# Patient Record
Sex: Female | Born: 1960 | Race: Black or African American | Hispanic: No | Marital: Single | Smoking: Never smoker
Health system: Southern US, Community
[De-identification: ages and names within clinical notes are randomized; demographics above are authoritative.]

## PROBLEM LIST (undated history)

## (undated) DIAGNOSIS — I10 Essential (primary) hypertension: Secondary | ICD-10-CM

## (undated) HISTORY — PX: NASAL SINUS SURGERY: SHX719

---

## 2015-04-06 ENCOUNTER — Encounter: Payer: Self-pay | Admitting: *Deleted

## 2015-04-06 ENCOUNTER — Ambulatory Visit (INDEPENDENT_AMBULATORY_CARE_PROVIDER_SITE_OTHER): Payer: BLUE CROSS/BLUE SHIELD

## 2015-04-06 ENCOUNTER — Ambulatory Visit
Admission: EM | Admit: 2015-04-06 | Discharge: 2015-04-06 | Disposition: A | Discharge status: Home / Self Care | Payer: BLUE CROSS/BLUE SHIELD | Attending: Family Medicine | Admitting: Family Medicine

## 2015-04-06 DIAGNOSIS — R0789 Other chest pain: Secondary | ICD-10-CM

## 2015-04-06 DIAGNOSIS — R079 Chest pain, unspecified: Secondary | ICD-10-CM | POA: Diagnosis not present

## 2015-04-06 DIAGNOSIS — I10 Essential (primary) hypertension: Secondary | ICD-10-CM | POA: Insufficient documentation

## 2015-04-06 HISTORY — DX: Essential (primary) hypertension: I10

## 2015-04-06 LAB — COMPREHENSIVE METABOLIC PANEL
ALBUMIN: 4.2 g/dL (ref 3.5–5.0)
ALT: 14 U/L (ref 14–54)
ANION GAP: 8 (ref 5–15)
AST: 27 U/L (ref 15–41)
Alkaline Phosphatase: 43 U/L (ref 38–126)
BUN: 10 mg/dL (ref 6–20)
CALCIUM: 9.4 mg/dL (ref 8.9–10.3)
CO2: 29 mmol/L (ref 22–32)
Chloride: 102 mmol/L (ref 101–111)
Creatinine, Ser: 0.98 mg/dL (ref 0.44–1.00)
GFR calc non Af Amer: >60 mL/min (ref >60)
GLUCOSE: 88 mg/dL (ref 65–99)
Potassium: 4 mmol/L (ref 3.5–5.1)
SODIUM: 139 mmol/L (ref 135–145)
TOTAL PROTEIN: 7.1 g/dL (ref 6.5–8.1)
Total Bilirubin: 1.5 mg/dL — ABNORMAL HIGH (ref 0.3–1.2)

## 2015-04-06 LAB — CBC WITH DIFFERENTIAL/PLATELET
BASOS ABS: 0 10*3/uL (ref 0–0.1)
BASOS PCT: 1 %
Eosinophils Absolute: 0.1 10*3/uL (ref 0–0.7)
Eosinophils Relative: 1 %
HEMATOCRIT: 40.6 % (ref 35.0–47.0)
Hemoglobin: 13.5 g/dL (ref 12.0–16.0)
Lymphocytes Relative: 24 %
Lymphs Abs: 1.3 10*3/uL (ref 1.0–3.6)
MCH: 30.8 pg (ref 26.0–34.0)
MCHC: 33.2 g/dL (ref 32.0–36.0)
MCV: 92.8 fL (ref 80.0–100.0)
MONO ABS: 0.5 10*3/uL (ref 0.2–0.9)
Monocytes Relative: 10 %
NEUTROS ABS: 3.5 10*3/uL (ref 1.4–6.5)
Neutrophils Relative %: 64 %
PLATELETS: 207 10*3/uL (ref 150–440)
RBC: 4.38 MIL/uL (ref 3.80–5.20)
RDW: 12.5 % (ref 11.5–14.5)
WBC: 5.5 10*3/uL (ref 3.6–11.0)

## 2015-04-06 LAB — TROPONIN I

## 2015-04-06 LAB — CK: CK TOTAL: 114 U/L (ref 38–234)

## 2015-04-06 LAB — CKMB (ARMC ONLY): CK, MB: 1.4 ng/mL (ref 0.5–5.0)

## 2015-04-06 LAB — FIBRIN DERIVATIVES D-DIMER (ARMC ONLY): FIBRIN DERIVATIVES D-DIMER (ARMC): 333 (ref 0–499)

## 2015-04-06 MED ORDER — GI COCKTAIL ~~LOC~~
30.0000 mL | Freq: Once | ORAL | Status: AC
  Administered 2015-04-06: 30 mL via ORAL

## 2015-04-06 MED ORDER — SUCRALFATE 1 G PO TABS: 2.0000 g | ORAL_TABLET | Freq: Two times a day (BID) | ORAL | Status: DC

## 2015-04-06 MED ORDER — ASPIRIN 81 MG PO CHEW
324.0000 mg | CHEWABLE_TABLET | Freq: Once | ORAL | Status: AC
  Administered 2015-04-06: 324 mg via ORAL

## 2015-04-06 NOTE — ED Notes (Signed)
[facility] [place] [facility] with Rx for Carafate. Take 2 tablets (2G) po bid x 7 days. #28. No refills. Per T. Betancourt NP as order written by Dr. Thurmond ButtsWade did not go through to pharmacy. Pt. Called and notified Rx called.

## 2015-04-06 NOTE — ED Notes (Signed)
Chest pain on and off x three weeks, feels like there is acid in her stomach, chest pain with eating at first, now complained of shortness of breath and pain in church yesterday.  Feels like a heaviness in her chest on and off for the last three weeks.

## 2015-04-06 NOTE — ED Provider Notes (Addendum)
CSN: [id number]     Arrival date & time 04/06/15  1050 History   First MD Initiated Contact with Patient 04/06/15 1121    Nurses notes were reviewed. Chief Complaint  Patient presents with  . Chest Pain    Patient had dental work about 3 weeks ago and soon afterwards started having chest pain. Chest pain was nonspecific but did radiate up into her chest and upper neck and esophagus. She states that morning time you see was the best time but it was she was exercising she did not have much symptoms such as which was walking and working out playing tennis. She found that when she was at rest started getting hungry started having this chest pain described above. The pain would get worse until she ate and then it would abate but then was come back again. This is been occurring off and on for the last 3 weeks and started up this morning as well. She states she's been drinking more sodas trying to belch think that would help but this morning she did not take any Caffeine products or carbonate product just water which did not do anything for her. Yesterday she had chest pain she took [place] powder states that she felt she was getting more discomfort and indigestion. She has a history hypertension but no history of diabetes no history coronary artery disease in the past either. Her father died in his early 3960s from congestive heart failure but her 3 sisters have been healthy.  She does not smoke. (Consider location/radiation/quality/duration/timing/severity/associated sxs/prior Treatment) Patient is a 54 y.o. female presenting with chest pain. The history is provided by the patient. No language interpreter was used.  Chest Pain Pain location:  Substernal area and epigastric Pain quality: radiating and stabbing   Pain radiates to:  Precordial region and epigastrium Pain radiates to the back: no   Pain severity:  No pain Onset quality:  Sudden Timing:  Intermittent Progression:  Waxing and waning Context:  movement   Relieved by:  Nothing Worsened by:  Nothing tried Ineffective treatments:  None tried Associated symptoms: abdominal pain and shortness of breath   Associated symptoms: no fever and no palpitations     Past Medical History  Diagnosis Date  . Hypertension    No past surgical history on file. Family History  Problem Relation Age of Onset  . Thyroid disease Mother   . Diabetes Father   . Heart failure Father    Social History  Substance Use Topics  . Smoking status: Not on file  . Smokeless tobacco: Never Used  . Alcohol Use: Yes   OB History    No data available     Review of Systems  Constitutional: Negative for fever and activity change.  HENT: Positive for dental problem.   Respiratory: Positive for shortness of breath.   Cardiovascular: Positive for chest pain. Negative for palpitations and leg swelling.  Gastrointestinal: Positive for abdominal pain.       Digestion-like symptoms for last 3 weeks as well.  All other systems reviewed and are negative.   Allergies  Yeast-related products  Home Medications   Prior to Admission medications   Medication Sig Start Date End Date Taking? Authorizing Provider  sucralfate (CARAFATE) 1 G tablet Take 2 tablets (2 g total) by mouth 2 (two) times daily. An hour before eating. 04/06/15   Hassan RowanEugene [place], MD   Meds Ordered and Administered this Visit   Medications  gi cocktail (Maalox,Lidocaine,Donnatal) (30 mLs Oral Given  04/06/15 1130)  aspirin chewable tablet 324 mg (324 mg Oral Given 04/06/15 1130)    BP 130/67 mmHg  Pulse 87  Resp 18  Ht  (1.651 m)  Wt 152 lb (68.947 kg)  BMI 25.29 kg/m2  SpO2 100% No data found.   Physical Exam  Constitutional: She is oriented to person, place, and time. She appears well-developed and well-nourished.  HENT:  Head: Normocephalic and atraumatic.  Eyes: Conjunctivae are normal. Pupils are equal, round, and reactive to light.  Neck: Normal range of motion. Neck  supple. No thyromegaly present.  Cardiovascular: Normal rate, regular rhythm and normal heart sounds.   Pulmonary/Chest: Effort normal and breath sounds normal. No respiratory distress. She has no wheezes.  Abdominal: Soft. Bowel sounds are normal. She exhibits no distension.  Musculoskeletal: Normal range of motion. She exhibits no edema.  Lymphadenopathy:    She has no cervical adenopathy.  Neurological: She is alert and oriented to person, place, and time. She has normal reflexes.  Skin: Skin is warm and dry. No erythema.  Vitals reviewed.   ED Course  Procedures (including critical care time)  Labs Review Labs Reviewed  COMPREHENSIVE METABOLIC PANEL - Abnormal; Notable for the following:    Total Bilirubin 1.5 (*)    All other components within normal limits  FIBRIN DERIVATIVES D-DIMER ([facility] ONLY)  TROPONIN I  CK  CKMB([facility] ONLY)  CBC WITH DIFFERENTIAL/PLATELET    Imaging Review Dg Chest 2 View  04/06/2015  CLINICAL DATA:  Mid sternal pain for 3 weeks, no injury EXAM: CHEST  2 VIEW COMPARISON:  None. FINDINGS: No active infiltrate or effusion is seen. Mediastinal and hilar contours are unremarkable. The heart is within normal limits in size. No acute bony abnormality is seen. IMPRESSION: No active cardiopulmonary disease. Electronically Signed   By: Dwyane Dee M.D.   On: 04/06/2015 12:17     Visual Acuity Review  Right Eye Distance:   Left Eye Distance:   Bilateral Distance:    Right Eye Near:   Left Eye Near:    Bilateral Near:      ED ECG REPORT   Date: 04/06/2015  EKG Time: 1:05 PM  Rate: 64  Rhythm: normal sinus rhythm,  normal EKG, normal sinus rhythm, there are no previous tracings available for comparison, normal sinus rhythm  Axis: 30  Intervals:none  ST&T Change: none  Narrative Interpretation: NSR         Results for orders placed or performed during the hospital encounter of 04/06/15  Fibrin derivatives D-Dimer ([facility] only)  Result Value Ref  Range   Fibrin derivatives D-dimer ([facility]) 333 0 - 499  Troponin I  Result Value Ref Range   Troponin I <0.03 <0.031 ng/mL  CK  Result Value Ref Range   Total CK 114 38 - 234 U/L  CKMB([facility] only)  Result Value Ref Range   CK, MB 1.4 0.5 - 5.0 ng/mL  Comprehensive metabolic panel  Result Value Ref Range   Sodium 139 135 - 145 mmol/L   Potassium 4.0 3.5 - 5.1 mmol/L   Chloride 102 101 - 111 mmol/L   CO2 29 22 - 32 mmol/L   Glucose, Bld 88 65 - 99 mg/dL   BUN 10 6 - 20 mg/dL   Creatinine, Ser 1.61 0.44 - 1.00 mg/dL   Calcium 9.4 8.9 - 09.6 mg/dL   Total Protein 7.1 6.5 - 8.1 g/dL   Albumin 4.2 3.5 - 5.0 g/dL   AST 27 15 -  41 U/L   ALT 14 14 - 54 U/L   Alkaline Phosphatase 43 38 - 126 U/L   Total Bilirubin 1.5 (H) 0.3 - 1.2 mg/dL   GFR calc non Af Amer >60 >60 mL/min   GFR calc Af Amer >60 >60 mL/min   Anion gap 8 5 - 15  CBC with Differential  Result Value Ref Range   WBC 5.5 3.6 - 11.0 K/uL   RBC 4.38 3.80 - 5.20 MIL/uL   Hemoglobin 13.5 12.0 - 16.0 g/dL   HCT 78.2 95.6 - 21.3 %   MCV 92.8 80.0 - 100.0 fL   MCH 30.8 26.0 - 34.0 pg   MCHC 33.2 32.0 - 36.0 g/dL   RDW 08.6 57.8 - 46.9 %   Platelets 207 150 - 440 K/uL   Neutrophils Relative % 64 %   Neutro Abs 3.5 1.4 - 6.5 K/uL   Lymphocytes Relative 24 %   Lymphs Abs 1.3 1.0 - 3.6 K/uL   Monocytes Relative 10 %   Monocytes Absolute 0.5 0.2 - 0.9 K/uL   Eosinophils Relative 1 %   Eosinophils Absolute 0.1 0 - 0.7 K/uL   Basophils Relative 1 %   Basophils Absolute 0.0 0 - 0.1 K/uL    MDM   1. Chest pain at rest   2. Chest pain, atypical      Patient was given a GI cocktail. Initially she stated that the GI cocktail did nothing for her. With further questioning she states that the bottom or the more mid sternal discomfort has improved she still has discomfort in the upper esophagus and upper sternal discomfort. Explained to her that the lab work were all normal chest x-ray was normal at this time cannot find  anything to indicate cardiac disease for some and having chest discomfort for 3 weeks. This point I recommend follow-up with her PCP possible stress test as an outpatient and referral to GI for upper endoscopy to evaluate her esophagus. We'll place to place her on Carafate 2 tablets twice a day to see if this helps with esophageal discomfort and go to the ED if chest pain recurs or becomes worse.   Hassan Rowan, MD 04/06/15 1210  Hassan Rowan, MD 04/06/15 [phone]

## 2015-04-06 NOTE — Discharge Instructions (Signed)
Cardiac-Specific Troponin I and T Test WHY AM I HAVING THIS TEST? You may have this test if you have experienced chest pain. The test can be used to determine if you have had a heart attack or injury to heart (cardiac) muscle. This test can also help predict the possibility of future heart attacks. This test measures the concentration of cardiac-specific troponin in your blood. Troponins are proteins that help muscles contract. There are three forms of troponin, including troponins C, I, and T. The types of troponins I and T that are found in cardiac muscle are different from the troponins I and T that are found in skeletal muscle. Therefore, testing can be done for cardiac-specific troponins I and T. These types of troponin are normally present in very small quantities in the blood. When there is damage to heart muscle cells, cardiac troponins I and T are released into circulation. The more damage there is, the greater the concentration of troponins I and T. When a person has a heart attack, levels of troponin can become elevated in the blood within 3-4 hours after injury and may remain elevated for 10-14 days. WHAT KIND OF SAMPLE IS TAKEN? A blood sample is required for this test. It is usually collected by inserting a needle into a vein. Usually, an initial blood sample is collected, and then another blood sample is collected 12 hours later. After these samples, you will have your blood tested daily for 3-5 days. You might also have it tested weekly for 5-6 weeks. HOW DO I PREPARE FOR THE TEST? There is no preparation required for this test. However, be aware that you will need to make arrangements to have your blood collected frequently.  WHAT ARE THE REFERENCE RANGES? Reference values are considered healthy values established after testing a large group of healthy people. Reference values may vary among different people, labs, and hospitals. It is your responsibility to obtain your test results. Ask  the lab or department performing the test when and how you will get your results. Reference values for cardiac troponins are as follows:  Cardiac troponin T: less than 0.1 ng/mL.  Cardiac troponin I: less than 0.03 ng/mL. WHAT DO THE RESULTS MEAN? Troponin values above the reference values may indicate:  Injury to the heart muscle.  Heart attack. Talk with your health care provider to discuss your results, treatment options, and if necessary, the need for more tests. Talk with your health care provider if you have any questions about your results.   This information is not intended to replace advice given to you by your health care provider. Make sure you discuss any questions you have with your health care provider.   Document Released: 06/18/2004 Document Revised: 06/06/2014 Document Reviewed: 10/09/2013 Elsevier Interactive Patient Education 2016 [employer].  Cardiac-Specific Troponin I and T Test WHY AM I HAVING THIS TEST? You may have this test if you have experienced chest pain. The test can be used to determine if you have had a heart attack or injury to heart (cardiac) muscle. This test can also help predict the possibility of future heart attacks. This test measures the concentration of cardiac-specific troponin in your blood. Troponins are proteins that help muscles contract. There are three forms of troponin, including troponins C, I, and T. The types of troponins I and T that are found in cardiac muscle are different from the troponins I and T that are found in skeletal muscle. Therefore, testing can be done for cardiac-specific troponins I  and T. These types of troponin are normally present in very small quantities in the blood. When there is damage to heart muscle cells, cardiac troponins I and T are released into circulation. The more damage there is, the greater the concentration of troponins I and T. When a person has a heart attack, levels of troponin can become elevated in  the blood within 3-4 hours after injury and may remain elevated for 10-14 days. WHAT KIND OF SAMPLE IS TAKEN? A blood sample is required for this test. It is usually collected by inserting a needle into a vein. Usually, an initial blood sample is collected, and then another blood sample is collected 12 hours later. After these samples, you will have your blood tested daily for 3-5 days. You might also have it tested weekly for 5-6 weeks. HOW DO I PREPARE FOR THE TEST? There is no preparation required for this test. However, be aware that you will need to make arrangements to have your blood collected frequently.  WHAT ARE THE REFERENCE RANGES? Reference values are considered healthy values established after testing a large group of healthy people. Reference values may vary among different people, labs, and hospitals. It is your responsibility to obtain your test results. Ask the lab or department performing the test when and how you will get your results. Reference values for cardiac troponins are as follows:  Cardiac troponin T: less than 0.1 ng/mL.  Cardiac troponin I: less than 0.03 ng/mL. WHAT DO THE RESULTS MEAN? Troponin values above the reference values may indicate:  Injury to the heart muscle.  Heart attack. Talk with your health care provider to discuss your results, treatment options, and if necessary, the need for more tests. Talk with your health care provider if you have any questions about your results.   This information is not intended to replace advice given to you by your health care provider. Make sure you discuss any questions you have with your health care provider.   Document Released: 06/18/2004 Document Revised: 06/06/2014 Document Reviewed: 10/09/2013 Elsevier Interactive Patient Education 2016 [employer]  Exercise Stress Electrocardiogram An exercise stress electrocardiogram is a test to check how blood flows to your heart. It is done to find areas of poor  blood flow. You will need to walk on a treadmill for this test. The electrocardiogram will record your heartbeat when you are at rest and when you are exercising. BEFORE THE PROCEDURE  Do not have drinks with caffeine or foods with caffeine for 24 hours before the test, or as told by your doctor. This includes coffee, tea (even decaf tea), sodas, chocolate, and cocoa.  Follow your doctor's instructions about eating and drinking before the test.  Ask your doctor what medicines you should or should not take before the test. Take your medicines with water unless told by your doctor not to.  If you use an inhaler, bring it with you to the test.  Bring a snack to eat after the test.  Do not  smoke for 4 hours before the test.  Do not put lotions, powders, creams, or oils on your chest before the test.  Wear comfortable shoes and clothing. PROCEDURE  You will have patches put on your chest. Small areas of your chest may need to be shaved. Wires will be connected to the patches.  Your heart rate will be watched while you are resting and while you are exercising.  You will walk on the treadmill. The treadmill will slowly get faster  to raise your heart rate.  The test will take about 1-2 hours. AFTER THE PROCEDURE  Your heart rate and blood pressure will be watched after the test.  You may return to your normal diet, activities, and medicines or as told by your doctor.   This information is not intended to replace advice given to you by your health care provider. Make sure you discuss any questions you have with your health care provider.   Document Released: 11/02/2007 Document Revised: 06/06/2014 Document Reviewed: 01/21/2013 Elsevier Interactive Patient Education 2016 [employer] Inc[employer]  Nonspecific Chest Pain It is often hard to find the cause of chest pain. There is always a chance that your pain could be related to something serious, such as a heart attack or a blood clot in your  lungs. Chest pain can also be caused by conditions that are not life-threatening. If you have chest pain, it is very important to follow up with your doctor.  HOME CARE  If you were prescribed an antibiotic medicine, finish it all even if you start to feel better.  Avoid any activities that cause chest pain.  Do not use any tobacco products, including cigarettes, chewing tobacco, or electronic cigarettes. If you need help quitting, ask your doctor.  Do not drink alcohol.  Take medicines only as told by your doctor.  Keep all follow-up visits as told by your doctor. This is important. This includes any further testing if your chest pain does not go away.  Your doctor may tell you to keep your head raised (elevated) while you sleep.  Make lifestyle changes as told by your doctor. These may include:  Getting regular exercise. Ask your doctor to suggest some activities that are safe for you.  Eating a heart-healthy diet. Your doctor or a diet specialist (dietitian) can help you to learn healthy eating options.  Maintaining a healthy weight.  Managing diabetes, if necessary.  Reducing stress. GET HELP IF:  Your chest pain does not go away, even after treatment.  You have a rash with blisters on your chest.  You have a fever. GET HELP RIGHT AWAY IF:  Your chest pain is worse.  You have an increasing cough, or you cough up blood.  You have severe belly (abdominal) pain.  You feel extremely weak.  You pass out (faint).  You have chills.  You have sudden, unexplained chest discomfort.  You have sudden, unexplained discomfort in your arms, back, neck, or jaw.  You have shortness of breath at any time.  You suddenly start to sweat, or your skin gets clammy.  You feel nauseous.  You vomit.  You suddenly feel light-headed or dizzy.  Your heart begins to beat quickly, or it feels like it is skipping beats. These symptoms may be an emergency. Do not wait to see if  the symptoms will go away. Get medical help right away. Call your local emergency services (911 in the [place] Do not drive yourself to the hospital.   This information is not intended to replace advice given to you by your health care provider. Make sure you discuss any questions you have with your health care provider.   Document Released: 11/02/2007 Document Revised: 06/06/2014 Document Reviewed: 12/20/2013 Elsevier Interactive Patient Education [employer]

## 2015-04-06 NOTE — OR Nursing (Signed)
Report to Leona CarryLynne Anne Wolfe, RN. Dr. Thurmond ButtsWade aware that GI cocktail given and little relief after 30 minutes.  Pain in chest is still vague, no shortness of breath or diaphoresis.

## 2016-10-16 IMAGING — CR DG CHEST 2V
2 series · 2 of 2 positions shown · non-contrast
Comparison: None.

CLINICAL DATA: Mid sternal pain for 3 weeks, no injury

EXAM:
CHEST  2 VIEW

[chest pa]
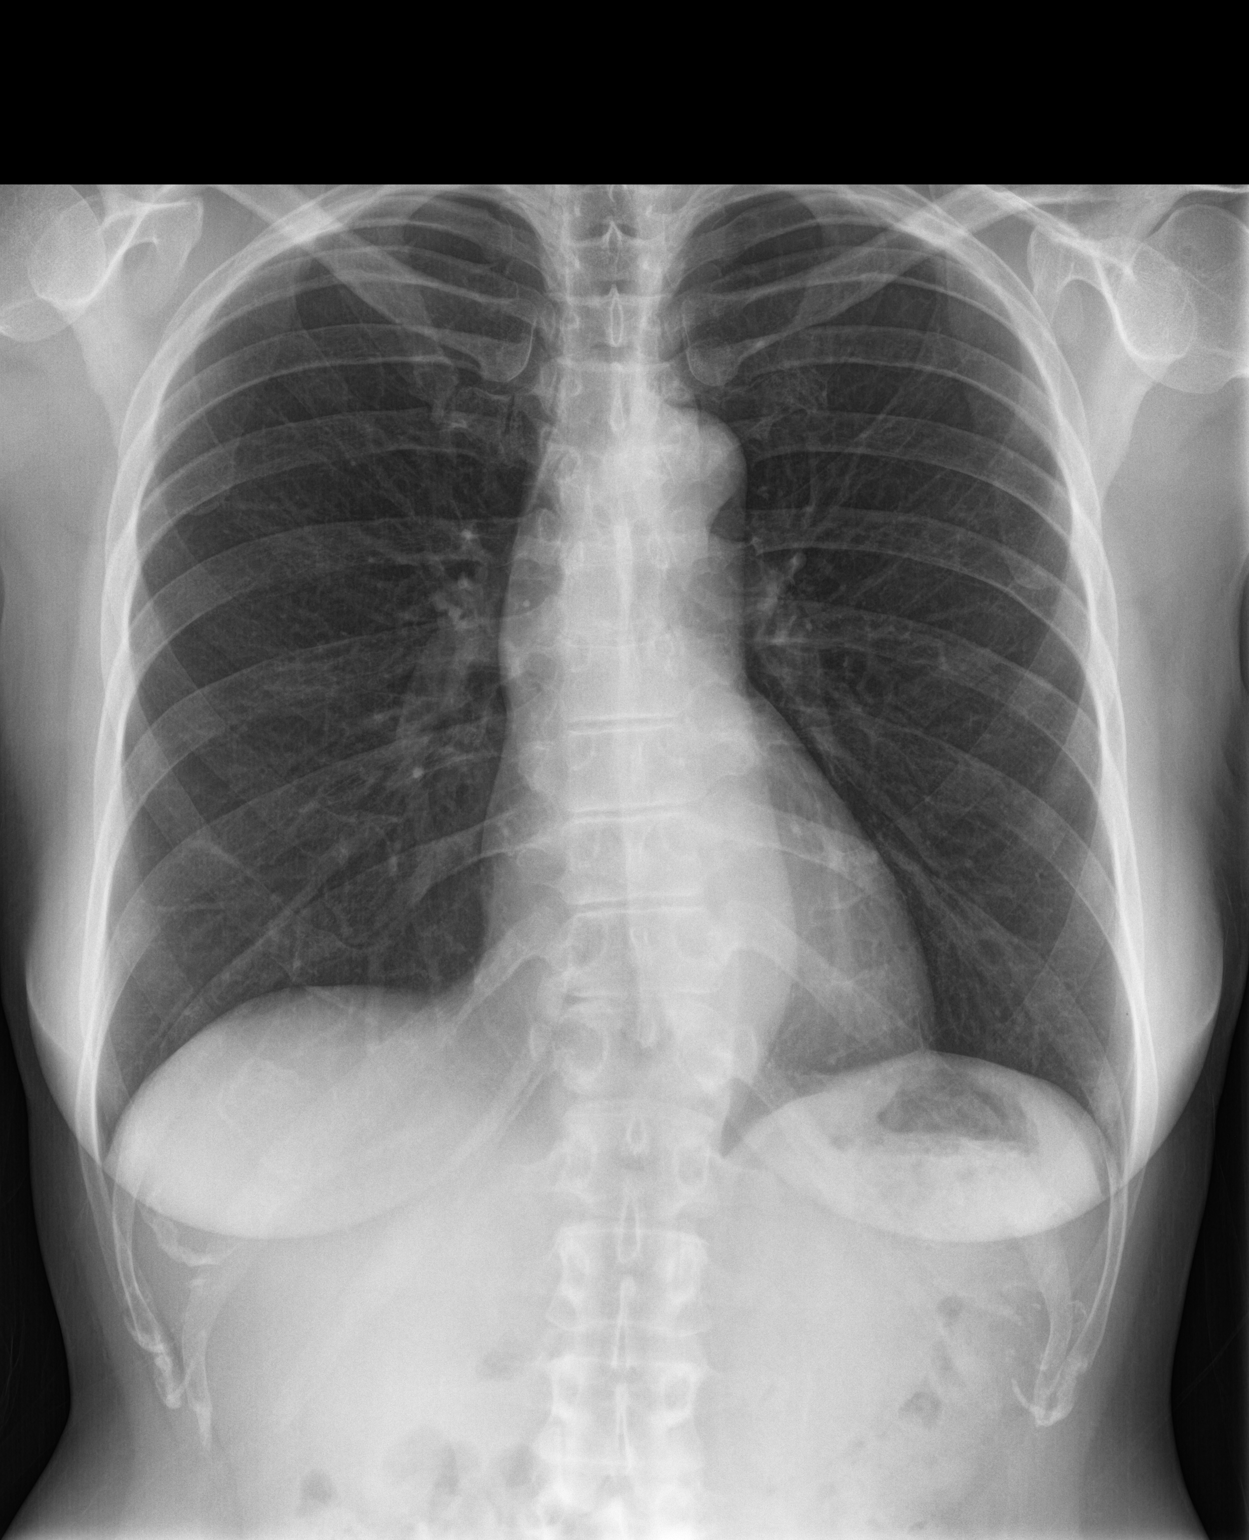

[chest lat]
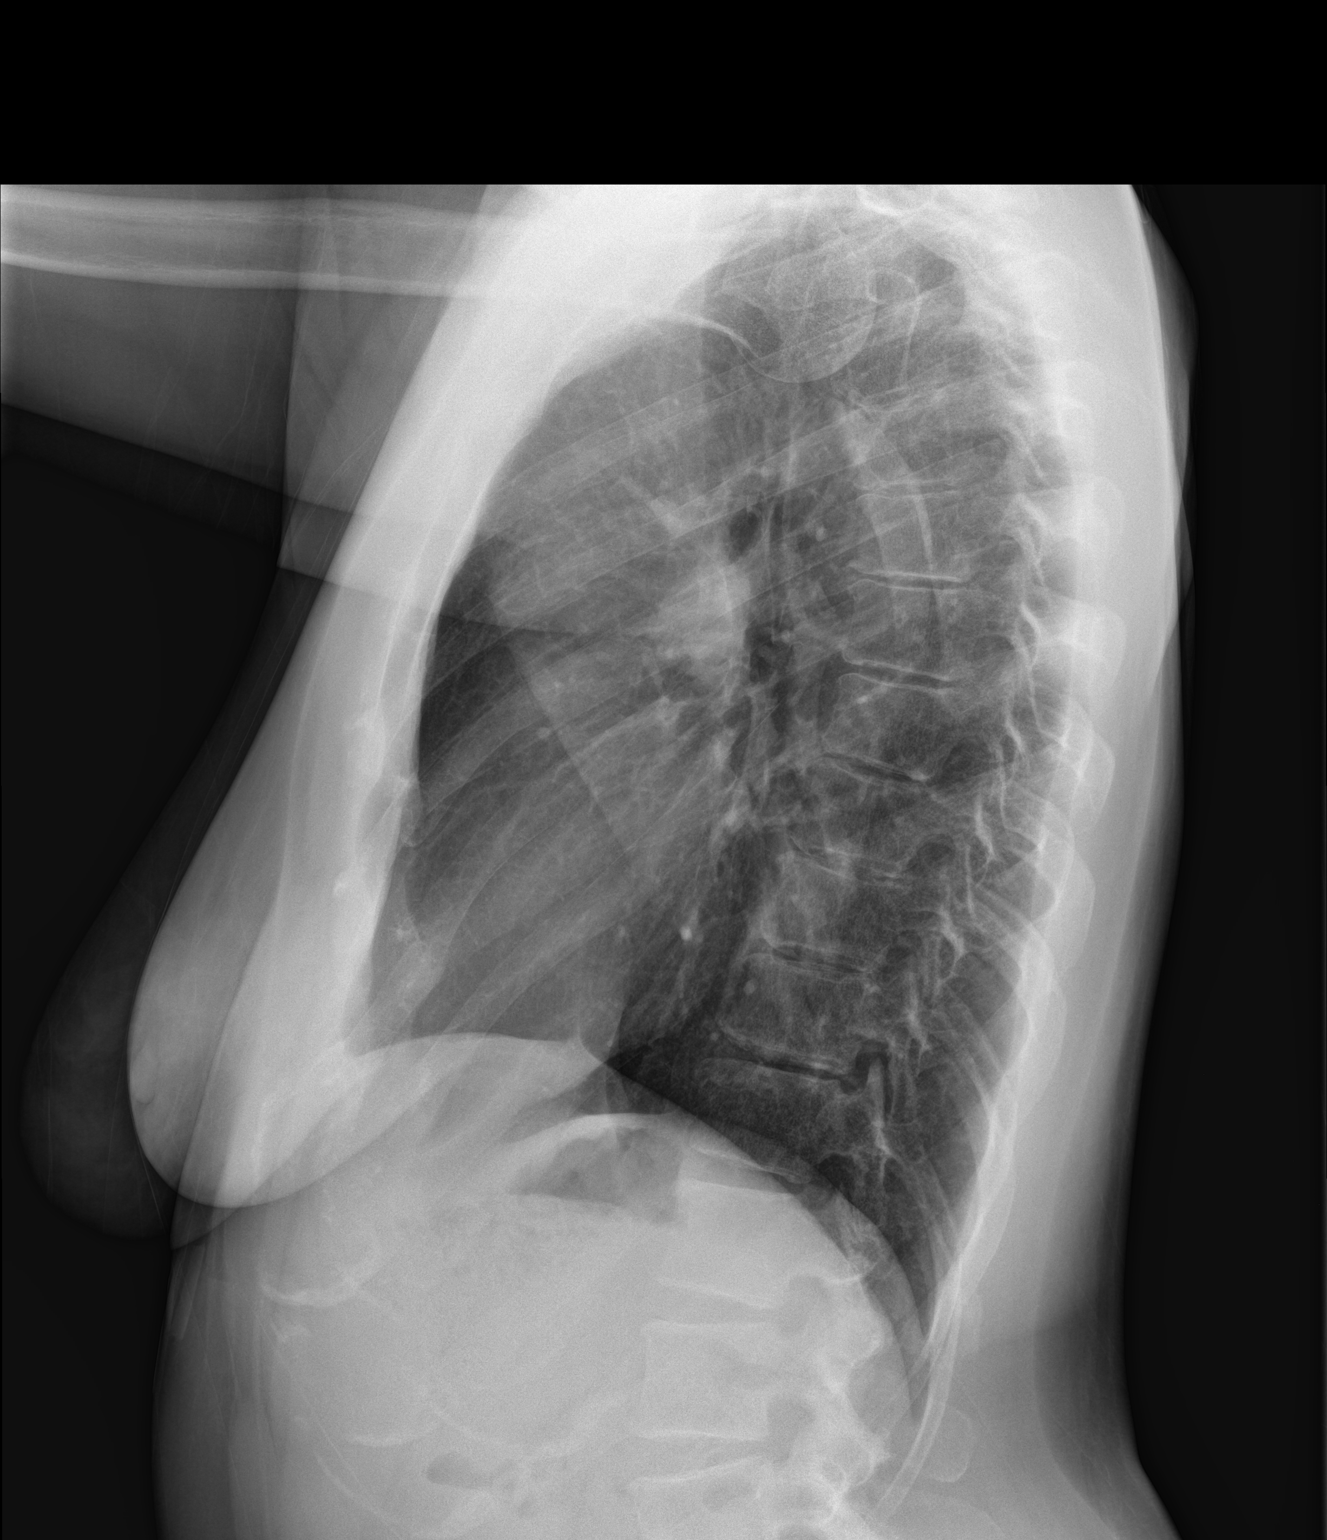

[2 of 2 positions shown; findings below may reference images not displayed]

FINDINGS: No active infiltrate or effusion is seen. Mediastinal and hilar
contours are unremarkable. The heart is within normal limits in
size. No acute bony abnormality is seen.
IMPRESSION: No active cardiopulmonary disease.

## 2022-04-06 ENCOUNTER — Ambulatory Visit: Payer: BLUE CROSS/BLUE SHIELD

## 2022-04-15 ENCOUNTER — Ambulatory Visit: Admit: 2022-04-15 | Payer: BLUE CROSS/BLUE SHIELD | Source: Home / Self Care

## 2022-04-15 ENCOUNTER — Ambulatory Visit (INDEPENDENT_AMBULATORY_CARE_PROVIDER_SITE_OTHER): Payer: BC Managed Care – PPO

## 2022-04-15 ENCOUNTER — Ambulatory Visit
Admission: EM | Admit: 2022-04-15 | Discharge: 2022-04-15 | Disposition: A | Discharge status: Home / Self Care | Payer: BC Managed Care – PPO | Attending: Emergency Medicine | Admitting: Emergency Medicine

## 2022-04-15 VITALS — BP 155/97 | HR 85 | Temp 98.1°F | Resp 14 | Ht 65.0 in | Wt 151.9 lb

## 2022-04-15 DIAGNOSIS — E119 Type 2 diabetes mellitus without complications: Secondary | ICD-10-CM | POA: Diagnosis not present

## 2022-04-15 DIAGNOSIS — J309 Allergic rhinitis, unspecified: Secondary | ICD-10-CM | POA: Diagnosis present

## 2022-04-15 DIAGNOSIS — Z1152 Encounter for screening for COVID-19: Secondary | ICD-10-CM | POA: Insufficient documentation

## 2022-04-15 DIAGNOSIS — R0982 Postnasal drip: Secondary | ICD-10-CM

## 2022-04-15 DIAGNOSIS — R519 Headache, unspecified: Secondary | ICD-10-CM | POA: Diagnosis not present

## 2022-04-15 DIAGNOSIS — Z79899 Other long term (current) drug therapy: Secondary | ICD-10-CM | POA: Diagnosis not present

## 2022-04-15 DIAGNOSIS — K219 Gastro-esophageal reflux disease without esophagitis: Secondary | ICD-10-CM | POA: Diagnosis not present

## 2022-04-15 DIAGNOSIS — R059 Cough, unspecified: Secondary | ICD-10-CM | POA: Diagnosis not present

## 2022-04-15 DIAGNOSIS — R058 Other specified cough: Secondary | ICD-10-CM | POA: Insufficient documentation

## 2022-04-15 DIAGNOSIS — I10 Essential (primary) hypertension: Secondary | ICD-10-CM | POA: Insufficient documentation

## 2022-04-15 LAB — RESP PANEL BY RT-PCR (RSV, FLU A&B, COVID)  RVPGX2
Influenza A by PCR: NEGATIVE
Influenza B by PCR: NEGATIVE
Resp Syncytial Virus by PCR: NEGATIVE
SARS Coronavirus 2 by RT PCR: NEGATIVE

## 2022-04-15 MED ORDER — PROMETHAZINE-DM 6.25-15 MG/5ML PO SYRP: 5.0000 mL | ORAL_SOLUTION | Freq: Four times a day (QID) | ORAL | 0 refills | Status: DC | PRN

## 2022-04-15 MED ORDER — AMOXICILLIN-POT CLAVULANATE 875-125 MG PO TABS: 1.0000 | ORAL_TABLET | Freq: Two times a day (BID) | ORAL | 0 refills | Status: DC

## 2022-04-15 MED ORDER — PREDNISONE 10 MG (21) PO TBPK: ORAL_TABLET | ORAL | 0 refills | Status: DC

## 2022-04-15 NOTE — ED Triage Notes (Signed)
Patient c/o cough and chest congestion for 9 days.  Patient also reports post nasal drip.  Patient finished her Azithromycin.  Patient also has been taking her Tessalon Perles for the cough. Patient denies fevers.

## 2022-04-15 NOTE — ED Provider Notes (Signed)
HPI  SUBJECTIVE:  Susan Zavala is a 61 y.o. female who presents with postnasal drip for 9 days, sore throat secondary to postnasal drip, cough productive of white mucus.  She is unable to sleep at night secondary to the cough.  No fevers, nasal congestion, rhinorrhea, sinus pain or pressure, wheezing or shortness of breath, dyspnea exertion, GERD symptoms.  No itchy, watery eyes, sneezing, however, she has multiple environmental allergies that are worse at this time of year.  She had an ED visit about 6 days ago, was thought to have bronchitis and was prescribed azithromycin and Tessalon.  She finished azithromycin today.  She has been taking Zyrtec, DayQuil, over-the-counter Alka-Seltzer plus.  No alleviating factors.  Symptoms are worse at night.  She had negative home COVID test.  No known COVID, flu, RSV exposure.  She got 4 doses of the COVID-vaccine.  She did not get this years flu vaccine.  Patient has a past history of GERD, hypertension, environmental allergies.  She states that her ENT normally prescribes  fluconazole 100 mg for 10 days and clotrimazole 10 mg troches twice daily for 14 days when her allergies get this severe, however, he is currently out of town.  No history of pulmonary disease, frequent sinusitis.  ENT: [facility]  Past Medical History:  Diagnosis Date   Hypertension     History reviewed. No pertinent surgical history.  Family History  Problem Relation Age of Onset   Thyroid disease Mother    Diabetes Father    Heart failure Father     Social History   Tobacco Use   Smoking status: Never   Smokeless tobacco: Never  Vaping Use   Vaping Use: Never used  Substance Use Topics   Alcohol use: Yes   Drug use: No    No current facility-administered medications for this encounter.  Current Outpatient Medications:    amoxicillin-clavulanate (AUGMENTIN) 875-125 MG tablet, Take 1 tablet by mouth every 12 (twelve) hours., Disp: 14 tablet, Rfl: 0   Cetirizine  HCl (ZYRTEC ALLERGY) 10 MG CAPS, Take 1 capsule every day by oral route., Disp: , Rfl:    hydrochlorothiazide (HYDRODIURIL) 25 MG tablet, Take 1 tablet by mouth daily., Disp: , Rfl:    irbesartan (AVAPRO) 150 MG tablet, Take 1 tablet every day by oral route for 30 days., Disp: , Rfl:    predniSONE (STERAPRED UNI-PAK 21 TAB) 10 MG (21) TBPK tablet, Dispense one 6 day pack. Take as directed with food., Disp: 21 tablet, Rfl: 0   promethazine-dextromethorphan (PROMETHAZINE-DM) 6.25-15 MG/5ML syrup, Take 5 mLs by mouth 4 (four) times daily as needed for cough., Disp: 118 mL, Rfl: 0   sucralfate (CARAFATE) 1 G tablet, Take 2 tablets (2 g total) by mouth 2 (two) times daily. An hour before eating., Disp: 120 tablet, Rfl: 0  Allergies  Allergen Reactions   Spironolactone Other (See Comments)   Yeast-Related Products      ROS  As noted in HPI.   Physical Exam  BP (!) 155/97 (BP Location: Right Arm)   Pulse 85   Temp 98.1 F (36.7 C) (Oral)   Resp 14   Ht 5\' 5"  (1.651 m)   Wt 68.9 kg   SpO2 97%   BMI 25.28 kg/m   Constitutional: Well developed, well nourished, no acute distress.  Clearing throat frequently. Eyes:  EOMI, conjunctiva normal bilaterally HENT: Normocephalic, atraumatic,mucus membranes moist.  Swollen erythematous turbinates.  Mild nasal congestion.  No sinus tenderness.  Positive postnasal drip.  Respiratory: Normal inspiratory effort, lungs clear bilaterally.  No anterior, lateral chest wall tenderness Cardiovascular: Normal rate, regular rhythm, no murmurs, rubs, gallops GI: nondistended skin: No rash, skin intact Musculoskeletal: no deformities Neurologic: Alert & oriented x 3, no focal neuro deficits Psychiatric: Speech and behavior appropriate   ED Course   Medications - No data to display  Orders Placed This Encounter  Procedures   Resp panel by RT-PCR (RSV, Flu A&B, Covid) Anterior Nasal Swab    Standing Status:   Standing    Number of Occurrences:   1    DG Chest 2 View    Standing Status:   Standing    Number of Occurrences:   1    Order Specific Question:   Reason for Exam (SYMPTOM  OR DIAGNOSIS REQUIRED)    Answer:   cough x 9 days r/o PNA    Results for orders placed or performed during the hospital encounter of 04/15/22 (from the past 24 hour(s))  Resp panel by RT-PCR (RSV, Flu A&B, Covid) Anterior Nasal Swab     Status: None   Collection Time: 04/15/22  7:01 PM   Specimen: Anterior Nasal Swab  Result Value Ref Range   SARS Coronavirus 2 by RT PCR NEGATIVE NEGATIVE   Influenza A by PCR NEGATIVE NEGATIVE   Influenza B by PCR NEGATIVE NEGATIVE   Resp Syncytial Virus by PCR NEGATIVE NEGATIVE   DG Chest 2 View  Result Date: 04/15/2022 CLINICAL DATA:  Cough EXAM: CHEST - 2 VIEW COMPARISON:  None Available. FINDINGS: The heart size and mediastinal contours are within normal limits. Both lungs are clear. The visualized skeletal structures are unremarkable. IMPRESSION: No active cardiopulmonary disease. Electronically Signed   By: Helyn Numbers M.D.   On: 04/15/2022 19:21    ED Clinical Impression  1. Allergic rhinitis with postnasal drip      ED Assessment/Plan     Outside records reviewed.  As noted in HPI.  Checking COVID, flu, RSV per patient request.  Chest x-ray due to the cough for 9 days.  .  Reviewed imaging independently.  Normal chest x-ray.  See radiology report for full details.  Chest x-ray negative for pneumonia. Patient has postnasal drip, which I suspect is causing her cough.  She has extensive environmental allergies, which get bad around this time of year.  She has postnasal drip, unfortunately, cannot do saline nasal irrigation or any nasal sprays as she states it makes her symptoms worse and causes severe headaches.  Will send home with 6-day prednisone taper, as I suspect her postnasal drip is coming from the allergies.  We will also send home with Augmentin for possible sinusitis.  Patient is requesting  fluconazole 100 mg for 10 days and clotrimazole 10 mg troches twice daily for 14 days, however, I am not convinced that she has a fungal sinusitis.  Would like to try treating it this way first, and she can follow-up with her ENT in [facility] if this does not resolve her symptoms  Discussed labs, imaging, MDM, treatment plan, and plan for follow-up with patient. Discussed sn/sx that should prompt return to the ED. patient agrees with plan.   Meds ordered this encounter  Medications   amoxicillin-clavulanate (AUGMENTIN) 875-125 MG tablet    Sig: Take 1 tablet by mouth every 12 (twelve) hours.    Dispense:  14 tablet    Refill:  0   predniSONE (STERAPRED UNI-PAK 21 TAB) 10 MG (21) TBPK tablet  Sig: Dispense one 6 day pack. Take as directed with food.    Dispense:  21 tablet    Refill:  0   promethazine-dextromethorphan (PROMETHAZINE-DM) 6.25-15 MG/5ML syrup    Sig: Take 5 mLs by mouth 4 (four) times daily as needed for cough.    Dispense:  118 mL    Refill:  0      *This clinic note was created using Scientist, clinical (histocompatibility and immunogenetics). Therefore, there may be occasional mistakes despite careful proofreading.  ?    Domenick Gong, MD 04/16/22 [phone]

## 2022-04-15 NOTE — Discharge Instructions (Addendum)
Your chest x-ray was negative for pneumonia.  I believe your allergies are causing extensive postnasal drip, which is in turn causing your cough.  I am giving you Promethazine DM to help with the cough.  And giving you prednisone to help dampen your allergic response, and send you home with Augmentin to treat a possible bacterial sinus infection.  Your COVID, flu, RSV are negative.  Please follow-up with your ENT if you are not better after finishing the antibiotics and steroids, for further evaluation and treatment.

## 2023-03-04 ENCOUNTER — Encounter: Payer: Self-pay | Admitting: Emergency Medicine

## 2023-03-04 ENCOUNTER — Ambulatory Visit
Admission: EM | Admit: 2023-03-04 | Discharge: 2023-03-04 | Disposition: A | Discharge status: Home / Self Care | Payer: BC Managed Care – PPO

## 2023-03-04 DIAGNOSIS — J0100 Acute maxillary sinusitis, unspecified: Secondary | ICD-10-CM

## 2023-03-04 MED ORDER — IPRATROPIUM BROMIDE 0.06 % NA SOLN: 2.0000 | Freq: Four times a day (QID) | NASAL | 12 refills | Status: AC

## 2023-03-04 MED ORDER — DOXYCYCLINE HYCLATE 100 MG PO CAPS: 100.0000 mg | ORAL_CAPSULE | Freq: Two times a day (BID) | ORAL | 0 refills | Status: AC

## 2023-03-04 NOTE — Discharge Instructions (Addendum)
The doxycycline twice daily with food for 10 days for treatment of your sinusitis.  Use the Atrovent nasal spray, 2 squirts up each nostril every 6 hours, as needed for nasal congestion and postnasal drip.  Perform sinus irrigation 2-3 times a day with a NeilMed sinus rinse kit and distilled water.  Do not use tap water.  You can use plain over-the-counter Mucinex every 6 hours to break up the stickiness of the mucus so your body can clear it.  Increase your oral fluid intake to thin out your mucus so that is also able for your body to clear more easily.  Take an over-the-counter probiotic, such as Culturelle-align-activia, 1 hour after each dose of antibiotic to prevent diarrhea.  If you develop any new or worsening symptoms return for reevaluation or see your primary care provider.

## 2023-03-04 NOTE — ED Triage Notes (Signed)
Patient c/o cough, sinus congestion and pressure for a month.  Patient no longer has an ENT due to him retiring.  Patient states that she gets a sinus infection every fall.  Patient denies fevers.

## 2023-03-04 NOTE — ED Provider Notes (Signed)
[facility]    CSN: [id number] Arrival date & time: 03/04/23  1049      History   Chief Complaint Chief Complaint  Patient presents with   Sinus Problem    HPI Susan Zavala is a 62 y.o. female.   HPI  62 year old female with a past medical history significant for hypertension and recurrent sinus infections presents for evaluation of 1 month worth of sinus pressure, postnasal drip, and cough that is productive for yellow sputum.  She denies any fever.  She was a patient of Dr. Armanda Magic in [place] but he recently retired and she has not found a new ENT.  Past Medical History:  Diagnosis Date   Hypertension     There are no problems to display for this patient.   Past Surgical History:  Procedure Laterality Date   NASAL SINUS SURGERY      OB History   No obstetric history on file.      Home Medications    Prior to Admission medications   Medication Sig Start Date End Date Taking? Authorizing Provider  Cetirizine HCl (ZYRTEC ALLERGY) 10 MG CAPS Take 1 capsule every day by oral route.   Yes [provider]  doxycycline (VIBRAMYCIN) 100 MG capsule Take 1 capsule (100 mg total) by mouth 2 (two) times daily. 03/04/23  Yes Becky Augusta, NP  hydrochlorothiazide (HYDRODIURIL) 25 MG tablet Take 1 tablet by mouth daily. 05/10/21  Yes [provider]  ipratropium (ATROVENT) 0.06 % nasal spray Place 2 sprays into both nostrils 4 (four) times daily. 03/04/23  Yes Becky Augusta, NP  irbesartan (AVAPRO) 150 MG tablet Take 1 tablet every day by oral route for 30 days. 03/28/22  Yes [provider]  valACYclovir (VALTREX) 500 MG tablet Take 500 mg by mouth daily.    [provider]    Family History Family History  Problem Relation Age of Onset   Thyroid disease Mother    Diabetes Father    Heart failure Father     Social History Social History   Tobacco Use   Smoking status: Never   Smokeless tobacco: Never  Vaping Use    Vaping status: Never Used  Substance Use Topics   Alcohol use: Yes   Drug use: No     Allergies   Spironolactone and Yeast-derived drug products   Review of Systems Review of Systems  Constitutional:  Negative for fever.  HENT:  Positive for congestion, postnasal drip and sinus pressure.   Respiratory:  Positive for cough.      Physical Exam Triage Vital Signs ED Triage Vitals  Encounter Vitals Group     BP      Systolic BP Percentile      Diastolic BP Percentile      Pulse      Resp      Temp      Temp src      SpO2      Weight      Height      Head Circumference      Peak Flow      Pain Score      Pain Loc      Pain Education      Exclude from Growth Chart    No data found.  Updated Vital Signs BP 123/82 (BP Location: Right Arm)   Pulse 79   Temp 98.7 F (37.1 C) (Oral)   Resp 14   Ht 5\' 5"  (1.651 m)  Wt 151 lb 14.4 oz (68.9 kg)   SpO2 100%   BMI 25.28 kg/m   Visual Acuity Right Eye Distance:   Left Eye Distance:   Bilateral Distance:    Right Eye Near:   Left Eye Near:    Bilateral Near:     Physical Exam Vitals and nursing note reviewed.  Constitutional:      Appearance: Normal appearance. She is not ill-appearing.  HENT:     Head: Normocephalic and atraumatic.     Right Ear: Tympanic membrane, ear canal and external ear normal. There is no impacted cerumen.     Left Ear: Tympanic membrane, ear canal and external ear normal. There is no impacted cerumen.     Nose: Congestion and rhinorrhea present.     Comments: Nasal mucosa is edematous and erythematous with greater edema present in the right nare than the left.  Patient has tenderness to compression of the right maxillary sinus.    Mouth/Throat:     Mouth: Mucous membranes are moist.     Pharynx: Oropharynx is clear. No oropharyngeal exudate or posterior oropharyngeal erythema.  Cardiovascular:     Rate and Rhythm: Normal rate.     Pulses: Normal pulses.     Heart sounds: Normal  heart sounds. No murmur heard.    No friction rub. No gallop.  Pulmonary:     Effort: Pulmonary effort is normal.     Breath sounds: Normal breath sounds. No wheezing, rhonchi or rales.  Skin:    General: Skin is warm and dry.     Capillary Refill: Capillary refill takes less than 2 seconds.     Findings: No rash.  Neurological:     General: No focal deficit present.     Mental Status: She is alert and oriented to person, place, and time.      UC Treatments / Results  Labs (all labs ordered are listed, but only abnormal results are displayed) Labs Reviewed - No data to display  EKG   Radiology No results found.  Procedures Procedures (including critical care time)  Medications Ordered in UC Medications - No data to display  Initial Impression / Assessment and Plan / UC Course  I have reviewed the triage vital signs and the nursing notes.  Pertinent labs & imaging results that were available during my care of the patient were reviewed by me and considered in my medical decision making (see chart for details).   Patient is a nontoxic-appearing 62 year old female presenting for evaluation of 1 month worth of sinus pressure and pain as outlined HPI above.  In the past she has been treated for both fungal and bacterial sinus infections with a combination of fluconazole, clotrimazole, and doxycycline.  Her ENT recently retired and she does not have a new 1.  On exam she does have inflamed nasal mucosa with tenderness to her right maxillary sinus.  Given that she has had the symptoms for a month I suspicious that she may have a bacterial acute on chronic sinus infection.  I will prescribe doxycycline 100 mg twice daily for 10 days given that she has been prescribed this in the past by her ENT.  She is requesting fungal culture of her nasal passages and I advised her that we are unable to provide that here at the urgent care.  She does have a refill of clotrimazole and fluconazole that  she was prescribed last year by her ENT and I have advised her that she is welcome  to take the medicine she has as prescribed if the antibiotics do not improve her symptoms.   Final Clinical Impressions(s) / UC Diagnoses   Final diagnoses:  Acute maxillary sinusitis, recurrence not specified     Discharge Instructions      The doxycycline twice daily with food for 10 days for treatment of your sinusitis.  Use the Atrovent nasal spray, 2 squirts up each nostril every 6 hours, as needed for nasal congestion and postnasal drip.  Perform sinus irrigation 2-3 times a day with a NeilMed sinus rinse kit and distilled water.  Do not use tap water.  You can use plain over-the-counter Mucinex every 6 hours to break up the stickiness of the mucus so your body can clear it.  Increase your oral fluid intake to thin out your mucus so that is also able for your body to clear more easily.  Take an over-the-counter probiotic, such as Culturelle-align-activia, 1 hour after each dose of antibiotic to prevent diarrhea.  If you develop any new or worsening symptoms return for reevaluation or see your primary care provider.      ED Prescriptions     Medication Sig Dispense Auth. Provider   doxycycline (VIBRAMYCIN) 100 MG capsule Take 1 capsule (100 mg total) by mouth 2 (two) times daily. 20 capsule Becky Augusta, NP   ipratropium (ATROVENT) 0.06 % nasal spray Place 2 sprays into both nostrils 4 (four) times daily. 15 mL Becky Augusta, NP      PDMP not reviewed this encounter.   Becky Augusta, NP 03/04/23 1128
